# Patient Record
Sex: Male | Born: 1995 | Hispanic: Yes | Marital: Single | Smoking: Current every day smoker
Health system: Southern US, Community
[De-identification: ages and names within clinical notes are randomized; demographics above are authoritative.]

---

## 2018-05-28 ENCOUNTER — Emergency Department (HOSPITAL_COMMUNITY)
Admission: EM | Admit: 2018-05-28 | Discharge: 2018-05-28 | Disposition: A | Discharge status: Home / Self Care | Payer: Self-pay | Attending: Emergency Medicine | Admitting: Emergency Medicine

## 2018-05-28 ENCOUNTER — Encounter (HOSPITAL_COMMUNITY): Payer: Self-pay | Admitting: Emergency Medicine

## 2018-05-28 DIAGNOSIS — S161XXA Strain of muscle, fascia and tendon at neck level, initial encounter: Secondary | ICD-10-CM | POA: Insufficient documentation

## 2018-05-28 DIAGNOSIS — Y92410 Unspecified street and highway as the place of occurrence of the external cause: Secondary | ICD-10-CM | POA: Insufficient documentation

## 2018-05-28 DIAGNOSIS — F17210 Nicotine dependence, cigarettes, uncomplicated: Secondary | ICD-10-CM | POA: Insufficient documentation

## 2018-05-28 DIAGNOSIS — Y939 Activity, unspecified: Secondary | ICD-10-CM | POA: Insufficient documentation

## 2018-05-28 DIAGNOSIS — Y998 Other external cause status: Secondary | ICD-10-CM | POA: Insufficient documentation

## 2018-05-28 MED ORDER — IBUPROFEN 400 MG PO TABS
600.0000 mg | ORAL_TABLET | Freq: Once | ORAL | Status: AC
  Administered 2018-05-28: 600 mg via ORAL
  Filled 2018-05-28: qty 1

## 2018-05-28 MED ORDER — CYCLOBENZAPRINE HCL 10 MG PO TABS: 10.0000 mg | ORAL_TABLET | Freq: Every evening | ORAL | 0 refills | Status: AC | PRN
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
## 2018-05-28 NOTE — ED Notes (Signed)
Pt called to room with no answer x2

## 2018-05-28 NOTE — ED Notes (Signed)
Declined W/C at D/C and was escorted to lobby by RN. 

## 2018-05-28 NOTE — Discharge Instructions (Addendum)
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
## 2020-05-25 ENCOUNTER — Emergency Department (HOSPITAL_COMMUNITY)
Admission: EM | Admit: 2020-05-25 | Discharge: 2020-05-25 | Disposition: A | Discharge status: Home / Self Care | Payer: 59 | Attending: Emergency Medicine | Admitting: Emergency Medicine

## 2020-05-25 ENCOUNTER — Emergency Department (HOSPITAL_COMMUNITY): Payer: 59

## 2020-05-25 ENCOUNTER — Other Ambulatory Visit: Payer: Self-pay

## 2020-05-25 ENCOUNTER — Encounter (HOSPITAL_COMMUNITY): Payer: Self-pay

## 2020-05-25 DIAGNOSIS — S93601A Unspecified sprain of right foot, initial encounter: Secondary | ICD-10-CM | POA: Diagnosis not present

## 2020-05-25 DIAGNOSIS — Z23 Encounter for immunization: Secondary | ICD-10-CM | POA: Insufficient documentation

## 2020-05-25 DIAGNOSIS — M545 Low back pain, unspecified: Secondary | ICD-10-CM | POA: Insufficient documentation

## 2020-05-25 DIAGNOSIS — Y93E5 Activity, floor mopping and cleaning: Secondary | ICD-10-CM | POA: Insufficient documentation

## 2020-05-25 DIAGNOSIS — F17210 Nicotine dependence, cigarettes, uncomplicated: Secondary | ICD-10-CM | POA: Diagnosis not present

## 2020-05-25 DIAGNOSIS — W11XXXA Fall on and from ladder, initial encounter: Secondary | ICD-10-CM | POA: Diagnosis not present

## 2020-05-25 DIAGNOSIS — Y9289 Other specified places as the place of occurrence of the external cause: Secondary | ICD-10-CM | POA: Insufficient documentation

## 2020-05-25 DIAGNOSIS — M79672 Pain in left foot: Secondary | ICD-10-CM | POA: Diagnosis present

## 2020-05-25 DIAGNOSIS — S93602A Unspecified sprain of left foot, initial encounter: Secondary | ICD-10-CM | POA: Diagnosis not present

## 2020-05-25 MED ORDER — TETANUS-DIPHTH-ACELL PERTUSSIS 5-2.5-18.5 LF-MCG/0.5 IM SUSY
0.5000 mL | PREFILLED_SYRINGE | Freq: Once | INTRAMUSCULAR | Status: AC
  Administered 2020-05-25: 0.5 mL via INTRAMUSCULAR
  Filled 2020-05-25: qty 0.5

## 2020-05-25 MED ORDER — HYDROCODONE-ACETAMINOPHEN 5-325 MG PO TABS
1.0000 | ORAL_TABLET | Freq: Once | ORAL | Status: AC
Start: 2020-05-25 — End: 2020-05-25
  Administered 2020-05-25: 1 via ORAL
  Filled 2020-05-25: qty 1

## 2020-05-25 NOTE — ED Triage Notes (Signed)
Pt was working on a ladder cleaning out gutters when he fell 16 ft hitting his feet and landing on his butt. Pt denies loc or hitting his head. C collar placed in triage but pt denies neck pain. +ROM in all extremities. Pt a.o, nad noted.

## 2020-05-25 NOTE — ED Notes (Signed)
DC instructions reviewed with patient and patient verbalized understanding. vss and nad. Patient post op shoe applied and crutches used for departure.

## 2020-05-25 NOTE — Progress Notes (Signed)
Orthopedic Tech Progress Note Patient Details:  Joseph Mooney 1996-02-21 [id number]  Ortho Devices Type of Ortho Device: Postop shoe/boot, Crutches Ortho Device/Splint Location: LLE Ortho Device/Splint Interventions: Ordered, Application, Adjustment   Post Interventions Patient Tolerated: Well Instructions Provided: Care of device, Poper ambulation with device, Adjustment of device   Trayce Caravello 05/25/2020, 8:02 PM

## 2020-05-25 NOTE — ED Triage Notes (Signed)
Emergency Medicine Provider Triage Evaluation Note  Joseph Mooney , a 24 y.o. male  was evaluated in triage.  Pt complains of bilateral foot pain after fall. Patient was standing on a ladder cleaning gutters when he slipped and fell, landing on his feet and then falling back onto his buttocks. Complains of pain in both feet as well as his back. Did not hit head, no LOC. Not anticoagulated, doesn't take any medications.  Review of Systems  Positive: Foot pain, back pain Negative: Weakness, numbness  Physical Exam  BP (!) 169/91   Pulse 98   Temp 99.8 F (37.7 C) (Oral)   Resp 18   Ht 5\' 11"  (1.803 m)   Wt 68 kg   SpO2 98%   BMI 20.92 kg/m  Gen:   Awake, no distress   HEENT:  Atraumatic  Resp:  Normal effort  Cardiac:  Normal rate Abd:   Nondistended, nontender  MSK:   Moves extremities without difficulty, with exception of pain in both feet/heels. Minor abrasion to left forearm without bony tenderness or swelling. Mild back pain, no point tenderness, no c-spine tenderness (c-collar in place) Neuro:  Speech clear  Medical Decision Making  Medically screening exam initiated at 4:04 PM.  Appropriate orders placed.  Joseph Mooney was informed that the remainder of the evaluation will be completed by another provider, this initial triage assessment does not replace that evaluation, and the importance of remaining in the ED until their evaluation is complete.  Clinical Impression  Fall from ladder, concern for calcaneous/foot fractures, concern for L-spine or T-spine fracture.    Irwin Brakeman, PA-C 05/25/20 1608

## 2020-05-25 NOTE — Discharge Instructions (Signed)
Follow up with orthopedics if pain continues. At home, elevate feet and apply ice for 20 minutes at a time. Take Motrin and Tylenol as needed as directed.

## 2020-05-25 NOTE — ED Provider Notes (Signed)
[facility] EMERGENCY DEPARTMENT Provider Note   CSN: [id number] Arrival date & time: 05/25/20  1555     History Chief Complaint  Patient presents with  . Fall    Joseph Mooney is a 24 y.o. male.  Joseph Mooney , a 24 y.o. male  was evaluated in triage.  Pt complains of bilateral foot pain after fall. Patient was standing on a ladder cleaning gutters when he slipped and fell, landing on his feet and then falling back onto his buttocks. Complains of pain in both feet as well as his back. Did not hit head, no LOC. Not anticoagulated, doesn't take any medications.        History reviewed. No pertinent past medical history.  There are no problems to display for this patient.   History reviewed. No pertinent surgical history.     No family history on file.  Social History   Tobacco Use  . Smoking status: Current Every Day Smoker    Packs/day: 0.50    Types: Cigarettes  . Smokeless tobacco: Never Used  Substance Use Topics  . Alcohol use: Not Currently  . Drug use: Never    Home Medications Prior to Admission medications   Medication Sig Start Date End Date Taking? Authorizing Provider  cyclobenzaprine (FLEXERIL) 10 MG tablet Take 1 tablet (10 mg total) by mouth at bedtime as needed for muscle spasms. Patient not taking: Reported on 05/25/2020 05/28/18   Robinson, [place] N, PA-C    Allergies    Patient has no known allergies.  Review of Systems   Review of Systems  Constitutional: Negative for fever.  Respiratory: Negative for shortness of breath.   Cardiovascular: Negative for chest pain.  Gastrointestinal: Negative for abdominal pain, nausea and vomiting.  Musculoskeletal: Positive for arthralgias, back pain, gait problem and joint swelling. Negative for neck pain and neck stiffness.  Skin: Positive for wound.  Allergic/Immunologic: Negative for immunocompromised state.  Neurological: Negative for dizziness, weakness and  headaches.  Hematological: Does not bruise/bleed easily.  Psychiatric/Behavioral: Negative for confusion.  All other systems reviewed and are negative.   Physical Exam Updated Vital Signs BP (!) 101/48   Pulse 71   Temp 99.8 F (37.7 C) (Oral)   Resp 19   Ht 5\' 11"  (1.803 m)   Wt 68 kg   SpO2 97%   BMI 20.92 kg/m   Physical Exam Vitals and nursing note reviewed.  Constitutional:      General: He is not in acute distress.    Appearance: He is well-developed. He is not diaphoretic.  HENT:     Head: Normocephalic and atraumatic.  Cardiovascular:     Rate and Rhythm: Normal rate and regular rhythm.     Pulses: Normal pulses.     Heart sounds: Normal heart sounds.  Pulmonary:     Effort: Pulmonary effort is normal.     Breath sounds: Normal breath sounds.  Abdominal:     Palpations: Abdomen is soft.     Tenderness: There is no abdominal tenderness.  Musculoskeletal:        General: Swelling and tenderness present.     Cervical back: No tenderness or bony tenderness.     Thoracic back: Tenderness present. No bony tenderness.     Lumbar back: Tenderness present. No bony tenderness.     Comments: Swelling with tenderness to bilateral feet. Tenderness to generalized back without bony tenderness. Very minor abrasion to left forearm.   Skin:    General: Skin  is warm and dry.     Findings: No erythema or rash.  Neurological:     Mental Status: He is alert and oriented to person, place, and time.     Sensory: No sensory deficit.     Motor: No weakness.  Psychiatric:        Behavior: Behavior normal.     ED Results / Procedures / Treatments   Labs (all labs ordered are listed, but only abnormal results are displayed) Labs Reviewed - No data to display  EKG None  Radiology DG Thoracic Spine 2 View  Result Date: 05/25/2020 CLINICAL DATA:  Larey Seat from ladder EXAM: LUMBAR SPINE - COMPLETE 4+ VIEW; THORACIC SPINE 2 VIEWS COMPARISON:  None. FINDINGS: Thoracic: Frontal and  lateral views demonstrate anatomic alignment. No acute fractures. Disc spaces are well preserved. Paraspinal soft tissues are unremarkable. Lumbar: Frontal, bilateral oblique, and lateral views demonstrate 5 non-rib-bearing lumbar type vertebral bodies in anatomic alignment. There are no fractures. Disc spaces are well preserved. Sacroiliac joints are normal. IMPRESSION: 1. Unremarkable thoracolumbar spine. Electronically Signed   By: Sharlet Salina M.D.   On: 05/25/2020 18:27   DG Lumbar Spine Complete  Result Date: 05/25/2020 CLINICAL DATA:  Larey Seat from ladder EXAM: LUMBAR SPINE - COMPLETE 4+ VIEW; THORACIC SPINE 2 VIEWS COMPARISON:  None. FINDINGS: Thoracic: Frontal and lateral views demonstrate anatomic alignment. No acute fractures. Disc spaces are well preserved. Paraspinal soft tissues are unremarkable. Lumbar: Frontal, bilateral oblique, and lateral views demonstrate 5 non-rib-bearing lumbar type vertebral bodies in anatomic alignment. There are no fractures. Disc spaces are well preserved. Sacroiliac joints are normal. IMPRESSION: 1. Unremarkable thoracolumbar spine. Electronically Signed   By: Sharlet Salina M.D.   On: 05/25/2020 18:27   DG Os Calcis Left  Result Date: 05/25/2020 CLINICAL DATA:  Fall from ladder. EXAM: LEFT OS CALCIS - 2+ VIEW COMPARISON:  None. FINDINGS: There is no evidence of fracture or other focal bone lesions. Soft tissues are unremarkable. IMPRESSION: Negative. Electronically Signed   By: Kennith Center M.D.   On: 05/25/2020 18:28   DG Os Calcis Right  Result Date: 05/25/2020 CLINICAL DATA:  Fall from ladder, approximately 16 feet, landing on buttocks EXAM: RIGHT FOOT COMPLETE - 3+ VIEW; RIGHT OS CALCIS - 2+ VIEW COMPARISON:  None. FINDINGS: No acute bony abnormality of the calcaneus or elsewhere in the right foot. Specifically, no fracture or traumatic malalignment within the limitations of a nonweightbearing exam. Normal bone mineralization. No worrisome osseous lesions.  No sizable ankle joint effusion. No significant swelling, soft tissue gas or foreign body. IMPRESSION: No acute fracture or traumatic abnormality of the calcaneus or right foot. Electronically Signed   By: Kreg Shropshire M.D.   On: 05/25/2020 18:28   DG Foot Complete Left  Result Date: 05/25/2020 CLINICAL DATA:  Fall from ladder. EXAM: LEFT FOOT - COMPLETE 3+ VIEW COMPARISON:  None. FINDINGS: There is no evidence of fracture or dislocation. There is no evidence of arthropathy or other focal bone abnormality. Soft tissues are unremarkable. IMPRESSION: Negative. Electronically Signed   By: Kennith Center M.D.   On: 05/25/2020 18:28   DG Foot Complete Right  Result Date: 05/25/2020 CLINICAL DATA:  Fall from ladder, approximately 16 feet, landing on buttocks EXAM: RIGHT FOOT COMPLETE - 3+ VIEW; RIGHT OS CALCIS - 2+ VIEW COMPARISON:  None. FINDINGS: No acute bony abnormality of the calcaneus or elsewhere in the right foot. Specifically, no fracture or traumatic malalignment within the limitations of a nonweightbearing exam. Normal  bone mineralization. No worrisome osseous lesions. No sizable ankle joint effusion. No significant swelling, soft tissue gas or foreign body. IMPRESSION: No acute fracture or traumatic abnormality of the calcaneus or right foot. Electronically Signed   By: Kreg Shropshire M.D.   On: 05/25/2020 18:28    Procedures Procedures (including critical care time)  Medications Ordered in ED Medications  HYDROcodone-acetaminophen (NORCO/VICODIN) 5-325 MG per tablet 1 tablet (1 tablet Oral Given 05/25/20 1636)  Tdap (BOOSTRIX) injection 0.5 mL (0.5 mLs Intramuscular Given 05/25/20 1637)    ED Course  I have reviewed the triage vital signs and the nursing notes.  Pertinent labs & imaging results that were available during my care of the patient were reviewed by me and considered in my medical decision making (see chart for details).  Clinical Course as of May 26 1943  Fri May 25, 2020    8540 24 year old male presents for evaluation of pain in his feet and back after a fall today as above. On exam, found to have swelling and tenderness of both feet, minor abrasion to the left forearm and back tenderness without point/bony tenderness. Concern for calcaneous fracture after the fall. XR of bilateral feet and calcaneous negative for acute bony injury. Xr T and L spine without acute injury. On recheck, pain has improved. Plan is to place in post op shoes, give crutches to use as needed, follow up with ortho if pain persists.    [LM]    Clinical Course User Index [LM] Alden Hipp   MDM Rules/Calculators/A&P                         Final Clinical Impression(s) / ED Diagnoses Final diagnoses:  Fall from ladder, initial encounter  Foot sprain, left, initial encounter  Foot sprain, right, initial encounter  Acute low back pain without sciatica, unspecified back pain laterality    Rx / DC Orders ED Discharge Orders    None       Jeannie Fend, PA-C 05/25/20 1944    Cathren Laine, MD 05/25/20 2231

## 2021-06-11 IMAGING — DX DG FOOT COMPLETE 3+V*R*
3 series · 3 of 3 positions shown · non-contrast
Comparison: None.

CLINICAL DATA: Fall from ladder, approximately 16 feet, landing on
buttocks

EXAM:
RIGHT FOOT COMPLETE - 3+ VIEW; RIGHT OS CALCIS - 2+ VIEW

[foot ap]
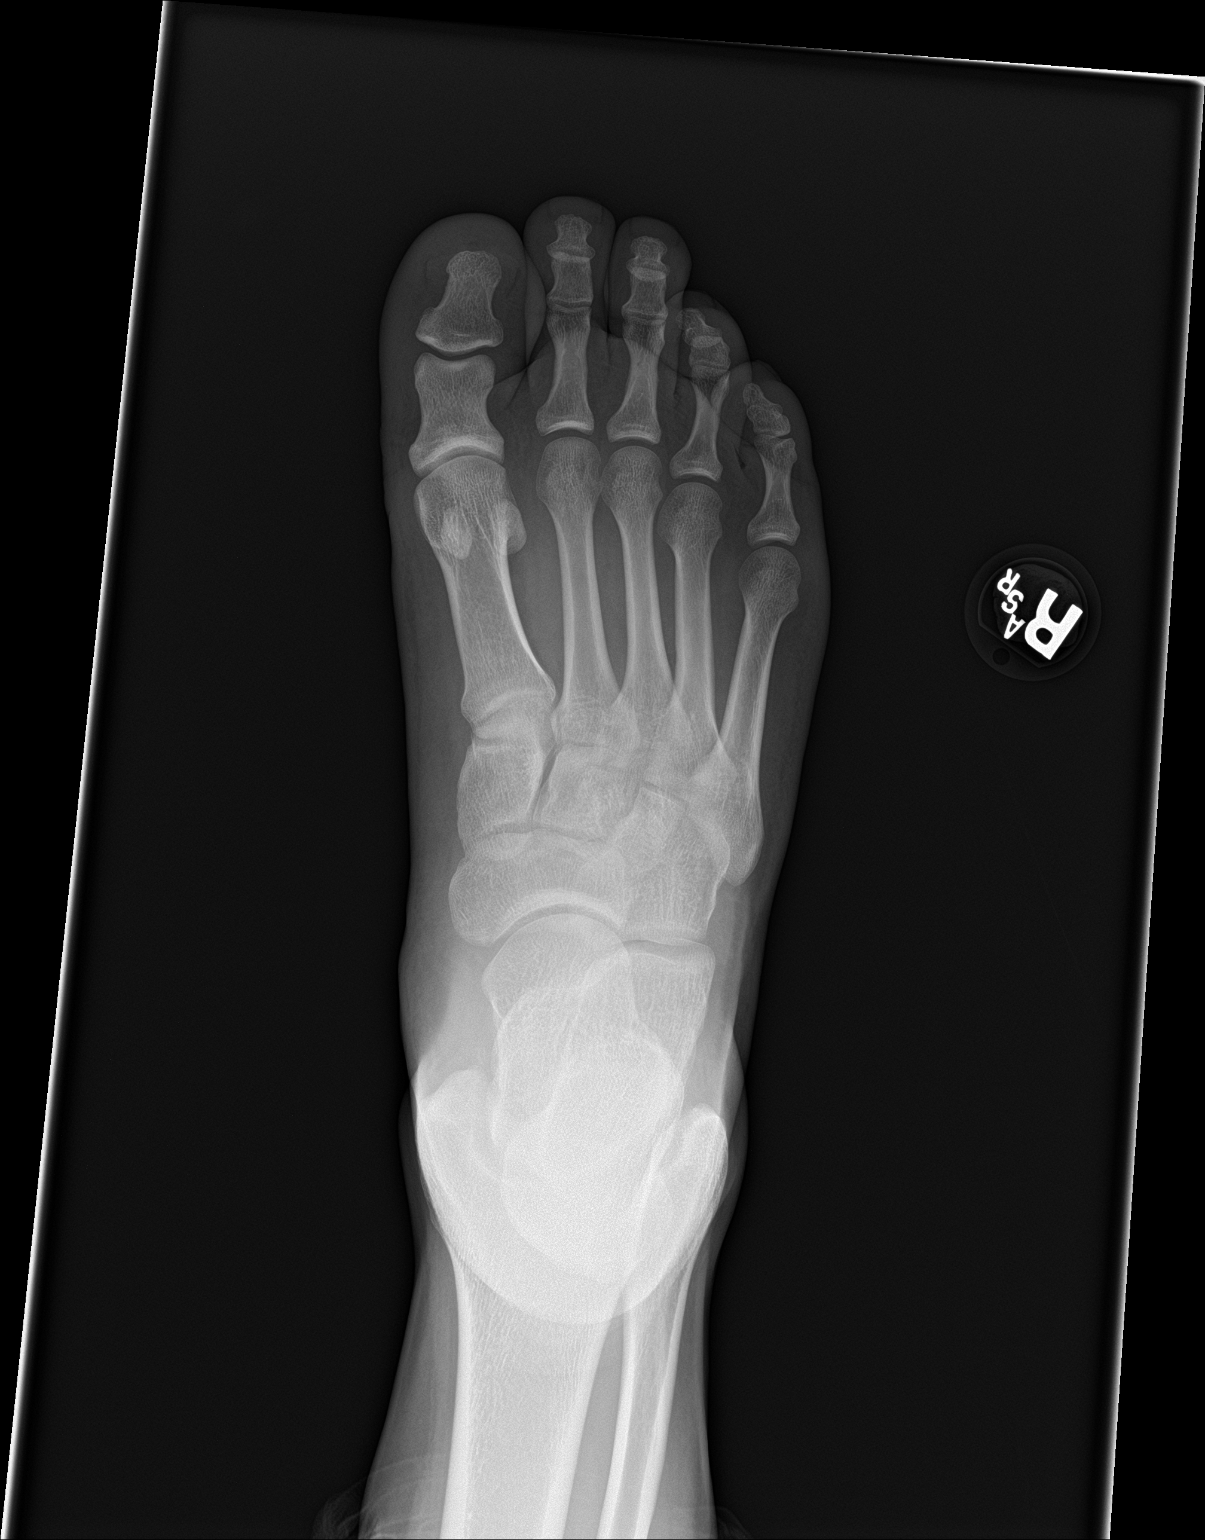

[foot obl]
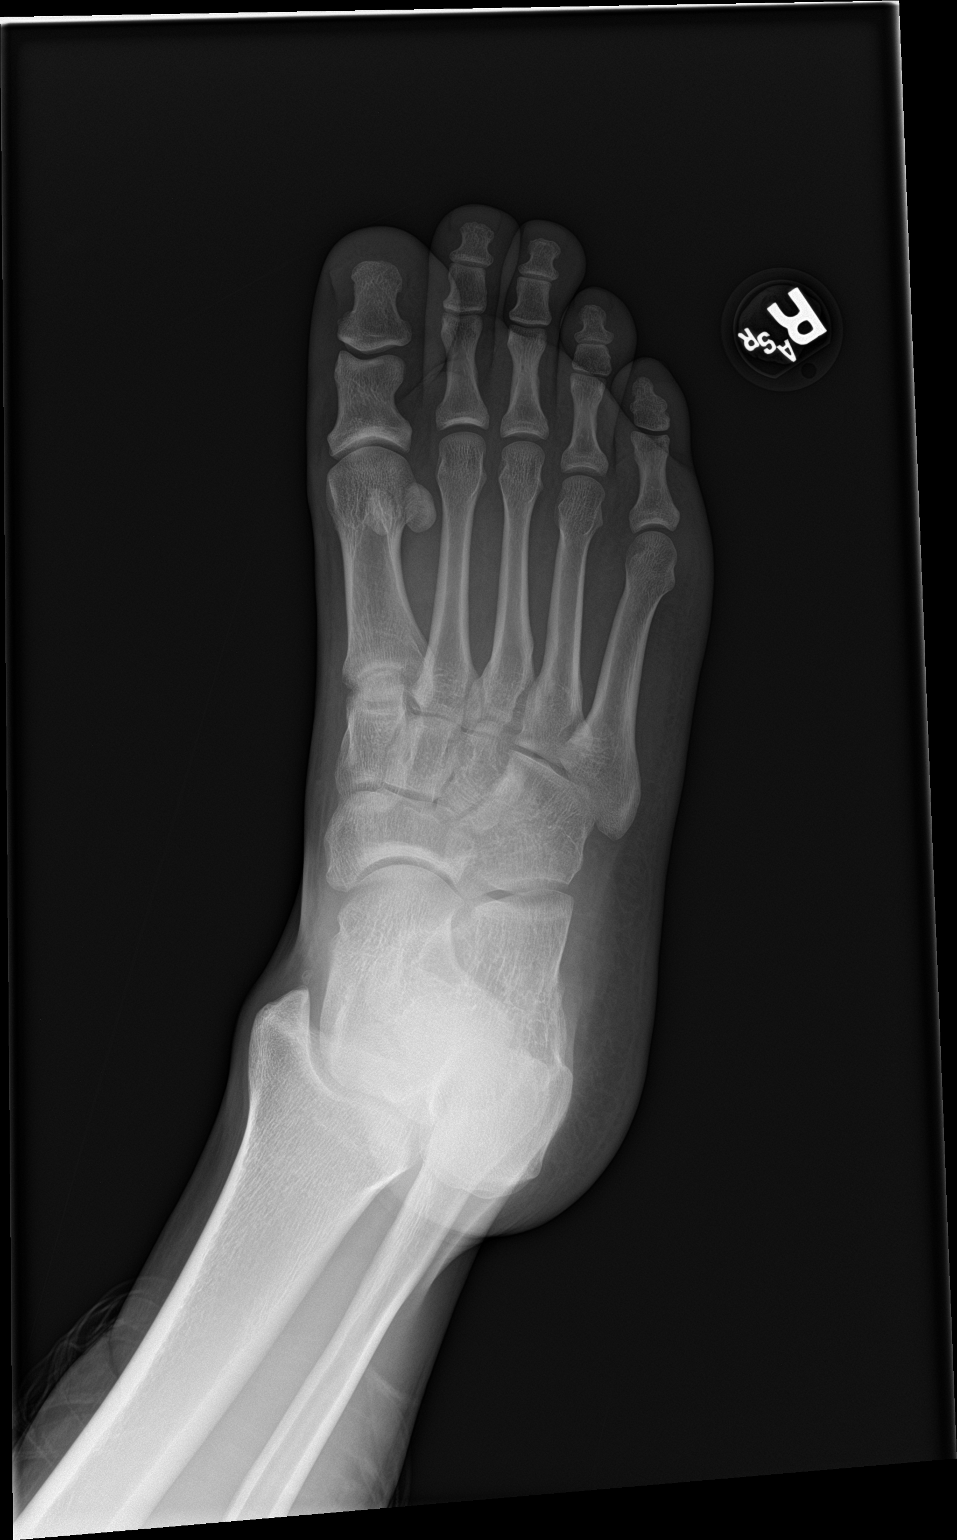

[foot lat]
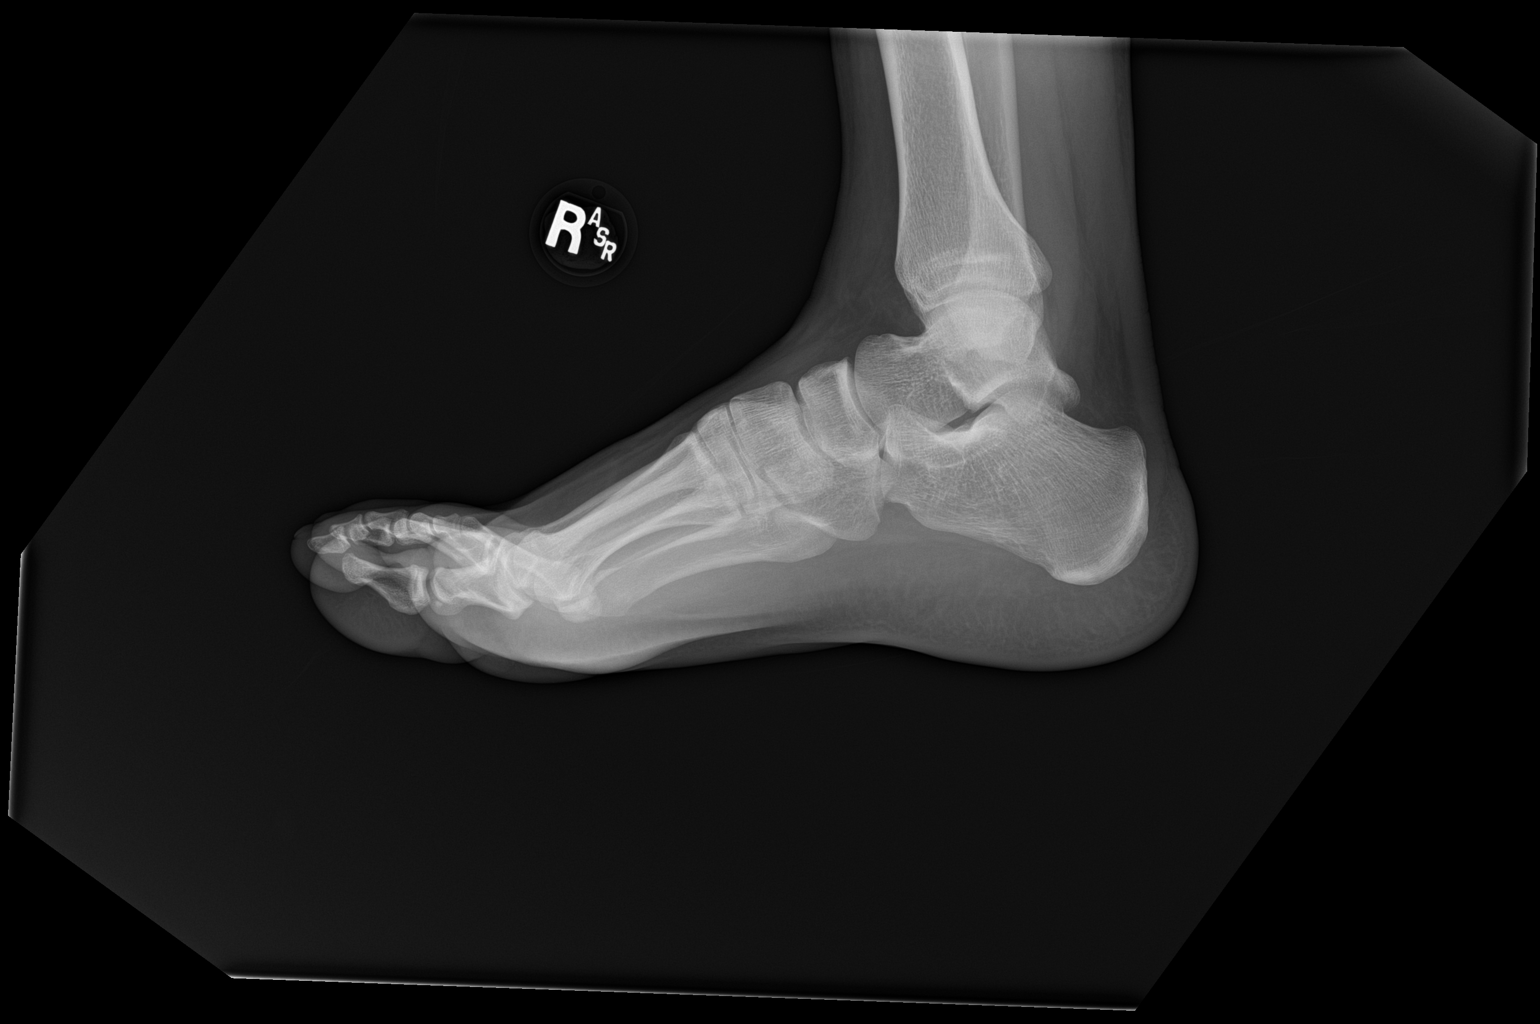

[3 of 3 positions shown; findings below may reference images not displayed]

FINDINGS: No acute bony abnormality of the calcaneus or elsewhere in the right
foot. Specifically, no fracture or traumatic malalignment within the
limitations of a nonweightbearing exam. Normal bone mineralization.
No worrisome osseous lesions. No sizable ankle joint effusion. No
significant swelling, soft tissue gas or foreign body.
IMPRESSION: No acute fracture or traumatic abnormality of the calcaneus or right
foot.

## 2021-06-11 IMAGING — DX DG OS CALCIS 2+V*L*
2 series · 2 of 2 positions shown · non-contrast
Comparison: None.

CLINICAL DATA: Fall from ladder.

EXAM:
LEFT OS CALCIS - 2+ VIEW

[calcaneus axial]
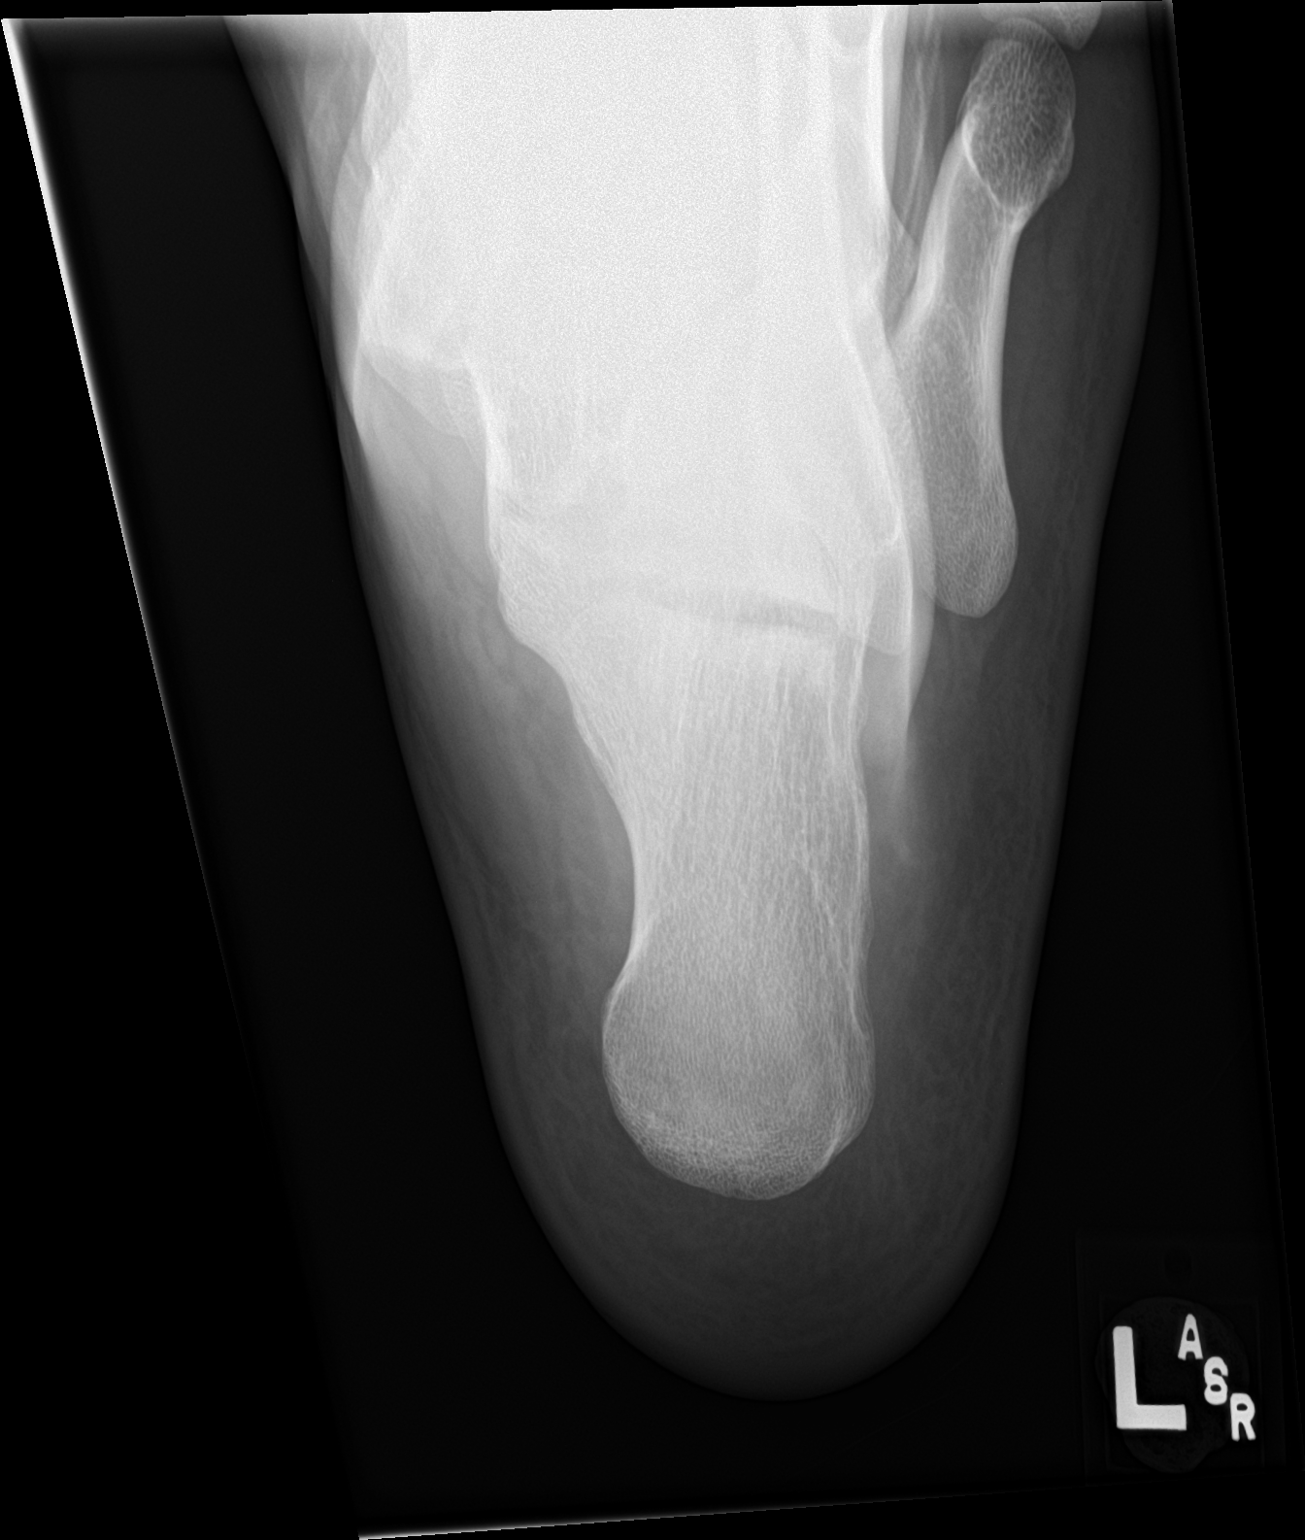

[calcaneus lat]
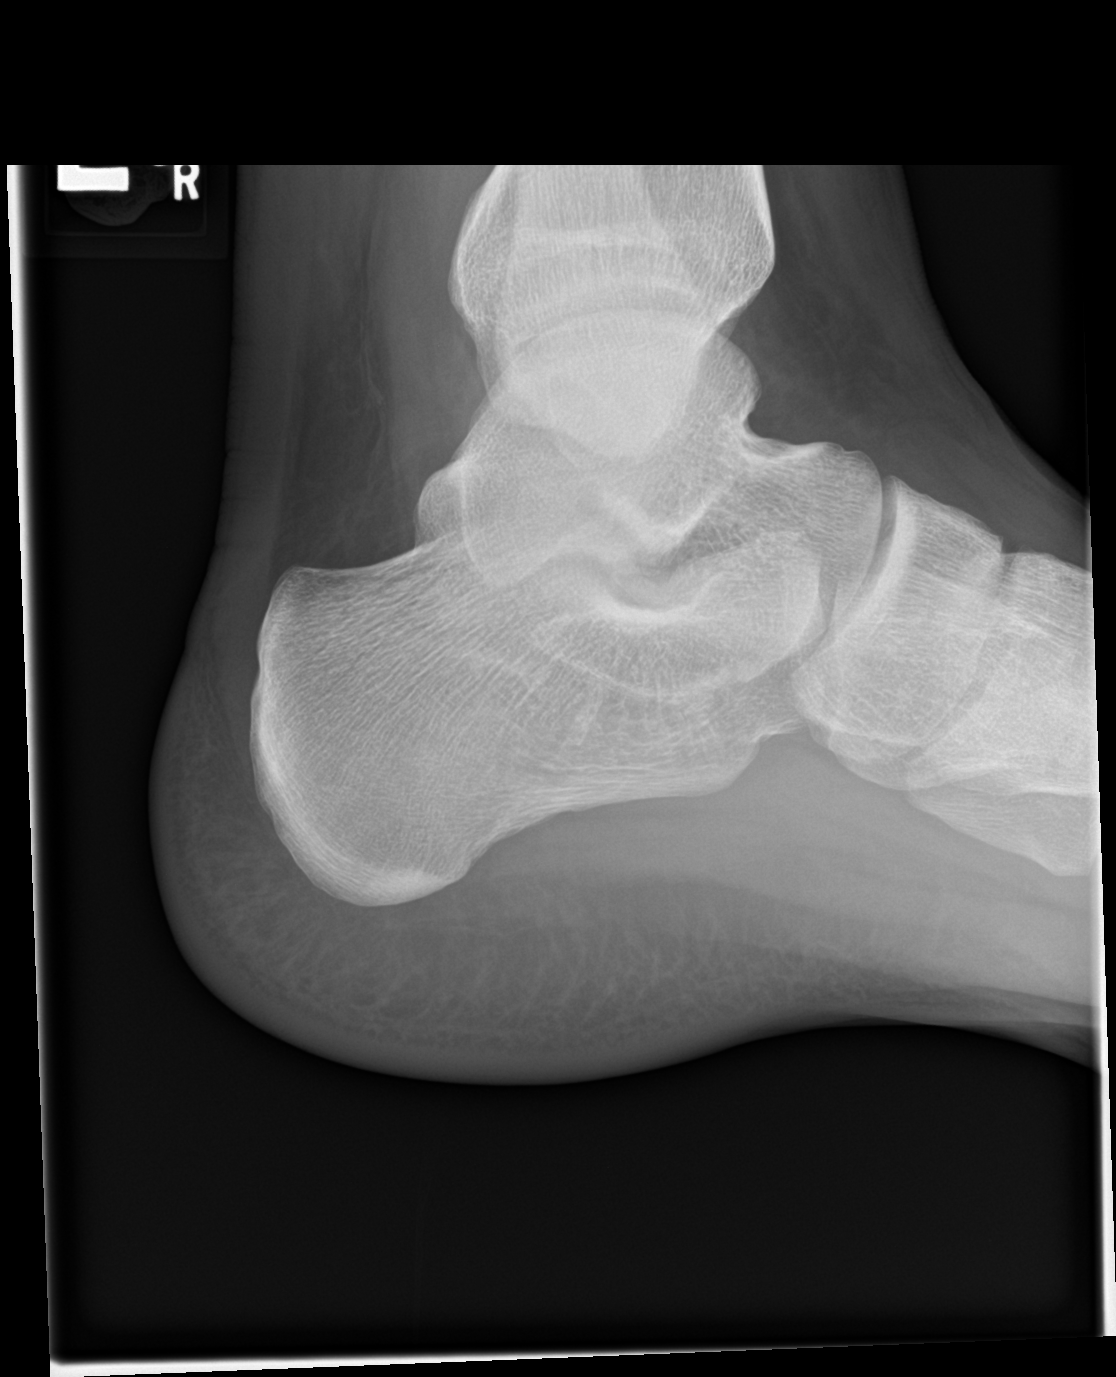

[2 of 2 positions shown; findings below may reference images not displayed]

FINDINGS: There is no evidence of fracture or other focal bone lesions. Soft
tissues are unremarkable.
IMPRESSION: Negative.
